# Patient Record
Sex: Male | Born: 2000 | Hispanic: Yes | Marital: Single | Smoking: Never smoker
Health system: Southern US, Community
[De-identification: ages and names within clinical notes are randomized; demographics above are authoritative.]

## PROBLEM LIST (undated history)

## (undated) DIAGNOSIS — M2141 Flat foot [pes planus] (acquired), right foot: Secondary | ICD-10-CM

## (undated) HISTORY — DX: Flat foot (pes planus) (acquired), right foot: M21.41

---
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
## 2019-03-06 ENCOUNTER — Emergency Department
Admission: EM | Admit: 2019-03-06 | Discharge: 2019-03-06 | Disposition: A | Discharge status: Home / Self Care | Payer: Medicaid Other | Source: Home / Self Care | Attending: Family Medicine | Admitting: Family Medicine

## 2019-03-06 ENCOUNTER — Other Ambulatory Visit: Payer: Self-pay

## 2019-03-06 ENCOUNTER — Emergency Department (INDEPENDENT_AMBULATORY_CARE_PROVIDER_SITE_OTHER): Payer: Medicaid Other

## 2019-03-06 DIAGNOSIS — R1013 Epigastric pain: Secondary | ICD-10-CM

## 2019-03-06 DIAGNOSIS — R140 Abdominal distension (gaseous): Secondary | ICD-10-CM

## 2019-03-06 LAB — POCT URINALYSIS DIP (MANUAL ENTRY)
Bilirubin, UA: NEGATIVE
Blood, UA: NEGATIVE
Glucose, UA: NEGATIVE mg/dL
Ketones, POC UA: NEGATIVE mg/dL
Leukocytes, UA: NEGATIVE
Nitrite, UA: NEGATIVE
Protein Ur, POC: NEGATIVE mg/dL
Spec Grav, UA: 1.02 (ref 1.010–1.025)
Urobilinogen, UA: 0.2 E.U./dL
pH, UA: 8.5 — AB (ref 5.0–8.0)

## 2019-03-06 LAB — POCT CBC W AUTO DIFF (K'VILLE URGENT CARE)

## 2019-03-06 MED ORDER — OMEPRAZOLE 40 MG PO CPDR: DELAYED_RELEASE_CAPSULE | ORAL | 0 refills | Status: DC

## 2019-03-06 NOTE — ED Triage Notes (Signed)
Pt c/o stomach pain x 1 week. Sometimes bloated feeling. Denies N/V/D. Worse when laying down. Has tried drinking tea and Tums with no relief.

## 2019-03-06 NOTE — ED Provider Notes (Signed)
[facility]    CSN: [id number] Arrival date & time: 03/06/19  1323     History   Chief Complaint Chief Complaint  Patient presents with  . Abdominal Pain    Stomach pain    HPI Jamie Ochoa is a 18 y.o. male.   Patient complains of onset of epigastric pain and abdominal bloating about one week ago.  The pain is immediately worse after eating, and during the past 3 days he has not been able to sleep because of the pain.  He denies nausea/vomiting and changes in bowel movements.  He reports that baking soda and Tums helped somewhat initially.  She states that he has had occasional heartburn in the past. He admits that he often eats at bedtime.  The history is provided by the patient.  Abdominal Pain  Pain location:  Epigastric Pain quality: aching and bloating   Pain radiates to:  Does not radiate Pain severity:  Moderate Onset quality:  Sudden Duration:  1 week Timing:  Intermittent Progression:  Worsening Chronicity:  New Context: awakening from sleep and eating   Context: not alcohol use, not diet changes, not previous surgeries, not recent illness and not suspicious food intake   Relieved by:  Nothing Worsened by:  Eating Ineffective treatments: Tums and baking soda. Associated symptoms: no anorexia, no belching, no chest pain, no chills, no constipation, no cough, no diarrhea, no dysuria, no fatigue, no fever, no flatus, no hematemesis, no hematochezia, no hematuria, no melena, no nausea, no shortness of breath, no sore throat and no vomiting     Past medical history negative  Patient Active Problem List   Diagnosis Date Noted  . BMI (body mass index), pediatric, 85% to less than 95% for age 60/13/2016     Past surgical history negative     Home Medications    Prior to Admission medications   Medication Sig Start Date End Date Taking? Authorizing Provider  meloxicam (MOBIC) 7.5 MG tablet Take 1 tablet (7.5 mg total) by mouth daily.  04/15/18   Belinda Fisher, PA-C  omeprazole (PRILOSEC) 40 MG capsule Take one cap PO daily, about 20 minutes before evening meal 03/06/19   Lattie Haw, MD    Family History Negative  Social History Social History   Tobacco Use  . Smoking status: Never Smoker  . Smokeless tobacco: Never Used  Substance Use Topics  . Alcohol use: No  . Drug use: Not on file     Allergies   Patient has no known allergies.   Review of Systems Review of Systems  Constitutional: Negative for chills, fatigue and fever.  HENT: Negative for sore throat.   Respiratory: Negative for cough and shortness of breath.   Cardiovascular: Negative for chest pain.  Gastrointestinal: Positive for abdominal pain. Negative for anorexia, constipation, diarrhea, flatus, hematemesis, hematochezia, melena, nausea and vomiting.  Genitourinary: Negative for dysuria and hematuria.  All other systems reviewed and are negative.    Physical Exam Triage Vital Signs ED Triage Vitals  Enc Vitals Group     BP 03/06/19 1348 123/83     Pulse Rate 03/06/19 1348 96     Resp --      Temp 03/06/19 1348 97.6 F (36.4 C)     Temp Source 03/06/19 1348 Oral     SpO2 03/06/19 1348 99 %     Weight 03/06/19 1349 211 lb (95.7 kg)     Height --      Head  Circumference --      Peak Flow --      Pain Score 03/06/19 1348 10     Pain Loc --      Pain Edu? --      Excl. in GC? --    No data found.  Updated Vital Signs BP 123/83 (BP Location: Right Arm)   Pulse 96   Temp 97.6 F (36.4 C) (Oral)   Wt 95.7 kg   SpO2 99%   Visual Acuity Right Eye Distance:   Left Eye Distance:   Bilateral Distance:    Right Eye Near:   Left Eye Near:    Bilateral Near:     Physical Exam Vitals signs and nursing note reviewed.  Constitutional:      General: He is not in acute distress. HENT:     Head: Normocephalic.     Right Ear: External ear normal.     Left Ear: External ear normal.     Nose: Nose normal.     Mouth/Throat:      Pharynx: Oropharynx is clear.  Eyes:     Pupils: Pupils are equal, round, and reactive to light.  Cardiovascular:     Heart sounds: Normal heart sounds.  Pulmonary:     Breath sounds: Normal breath sounds.  Abdominal:     General: Abdomen is flat. Bowel sounds are normal. There is no distension.     Palpations: Abdomen is soft. There is no hepatomegaly, splenomegaly or mass.     Tenderness: There is abdominal tenderness in the epigastric area. There is no right CVA tenderness or left CVA tenderness. Negative signs include Murphy's sign and McBurney's sign.    Musculoskeletal:     Right lower leg: No edema.     Left lower leg: No edema.  Lymphadenopathy:     Cervical: No cervical adenopathy.  Skin:    General: Skin is warm and dry.     Findings: No rash.  Neurological:     Mental Status: He is alert and oriented to person, place, and time.      UC Treatments / Results  Labs (all labs ordered are listed, but only abnormal results are displayed) Labs Reviewed  POCT URINALYSIS DIP (MANUAL ENTRY) - Abnormal; Notable for the following components:      Result Value   pH, UA 8.5 (*)    All other components within normal limits  POCT CBC W AUTO DIFF ([facility]):  WBC 6.6; LY 46.5; MO 3.6; GR 49.9; Hgb 15.0; Platelets 226     EKG None  Radiology Dg Abdomen 1 View  Result Date: 03/06/2019 CLINICAL DATA:  18 year old male with history of epigastric pain and bloating for the past 3 days. EXAM: ABDOMEN - 1 VIEW COMPARISON:  No priors. FINDINGS: The bowel gas pattern is normal. No radio-opaque calculi or other significant radiographic abnormality are seen. IMPRESSION: Negative. Electronically Signed   By: Trudie Reedaniel  Entrikin M.D.   On: 03/06/2019 14:49    Procedures Procedures (including critical care time)  Medications Ordered in UC Medications - No data to display  Initial Impression / Assessment and Plan / UC Course  I have reviewed the triage vital signs and the  nursing notes.  Pertinent labs & imaging results that were available during my care of the patient were reviewed by me and considered in my medical decision making (see chart for details).    Negative urinalysis, CBC, and KUB reassuring. ?GERD, Gastric ulcer Begin Omeprazole. Followup with Family  Doctor in about 2 weeks.   Final Clinical Impressions(s) / UC Diagnoses   Final diagnoses:  Abdominal pain, epigastric     Discharge Instructions     Avoid bedtime snacks or meals    ED Prescriptions    Medication Sig Dispense Auth. Provider   omeprazole (PRILOSEC) 40 MG capsule Take one cap PO daily, about 20 minutes before evening meal 30 capsule Lattie Haw, MD        Lattie Haw, MD 03/06/19 2150

## 2019-03-06 NOTE — Discharge Instructions (Addendum)
Avoid bedtime snacks or meals

## 2020-09-04 IMAGING — DX ABDOMEN - 1 VIEW
2 series · 2 of 2 positions shown · non-contrast
Comparison: No priors.

CLINICAL DATA: 17-year-old male with history of epigastric pain and
bloating for the past 3 days.

EXAM:
ABDOMEN - 1 VIEW

[abdomen kub (1 of 2)]
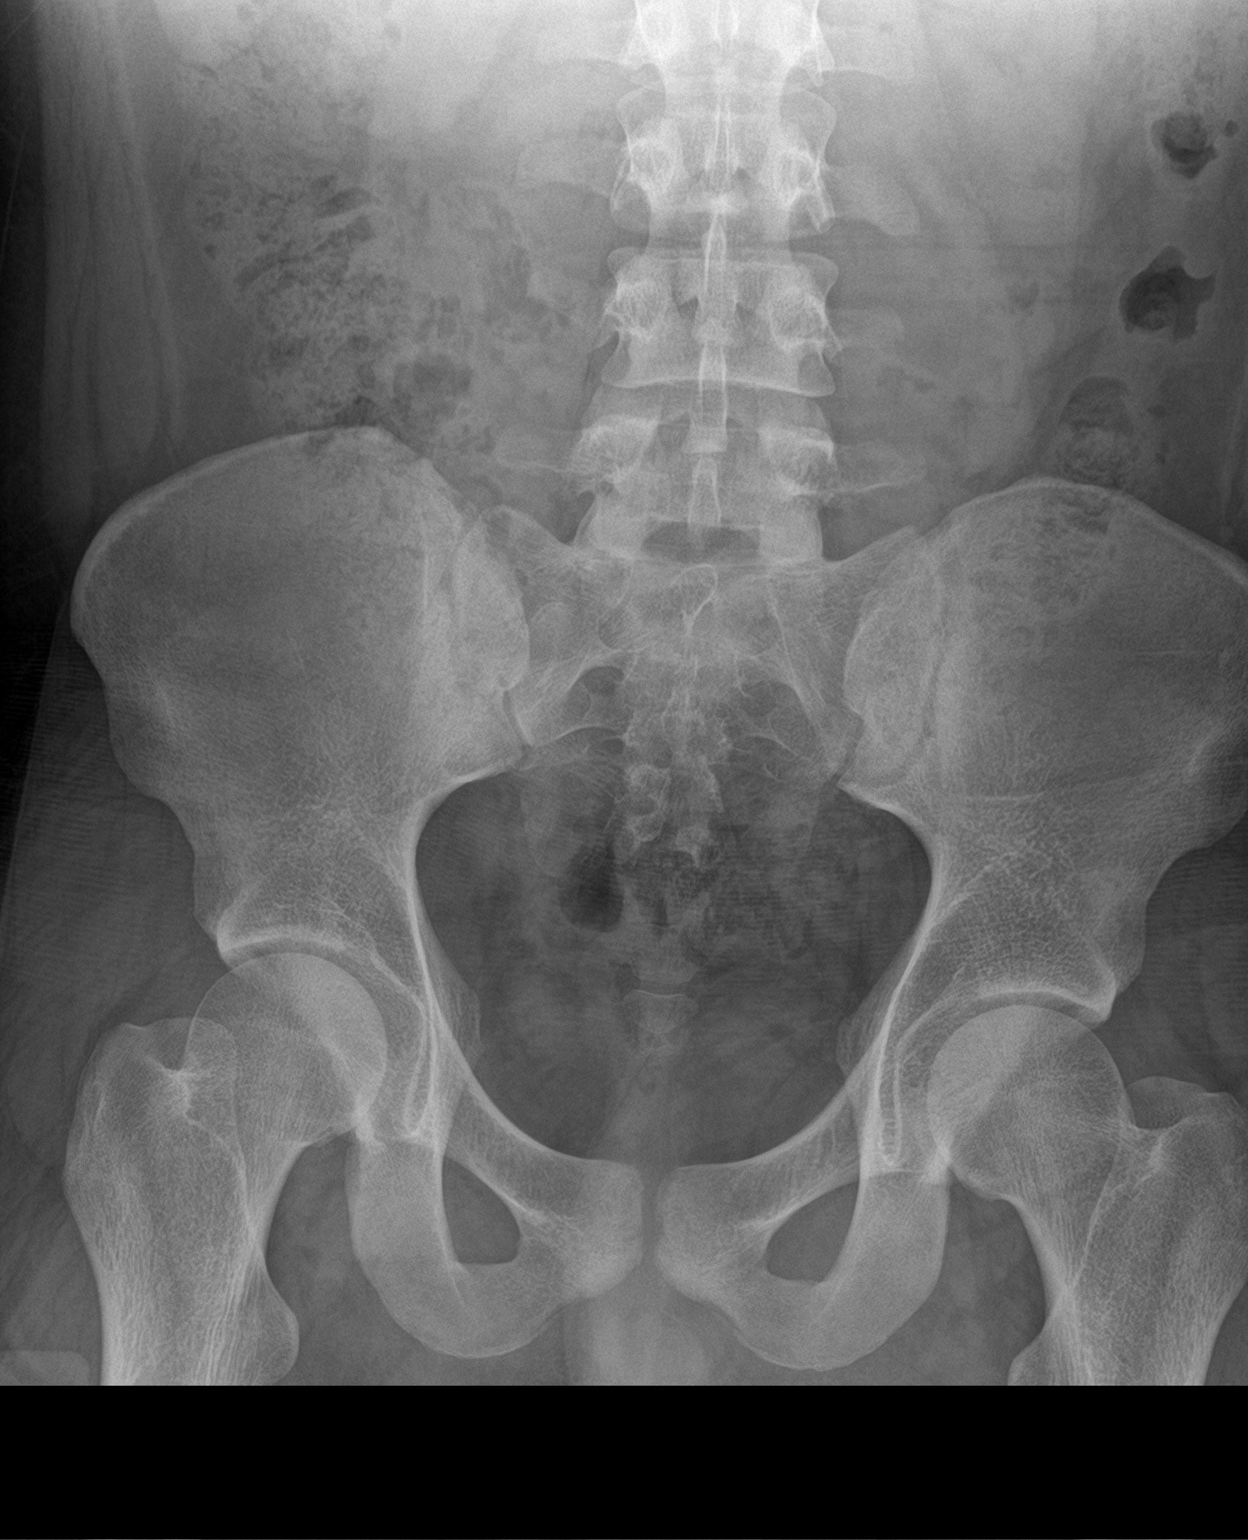

[abdomen kub (2 of 2)]
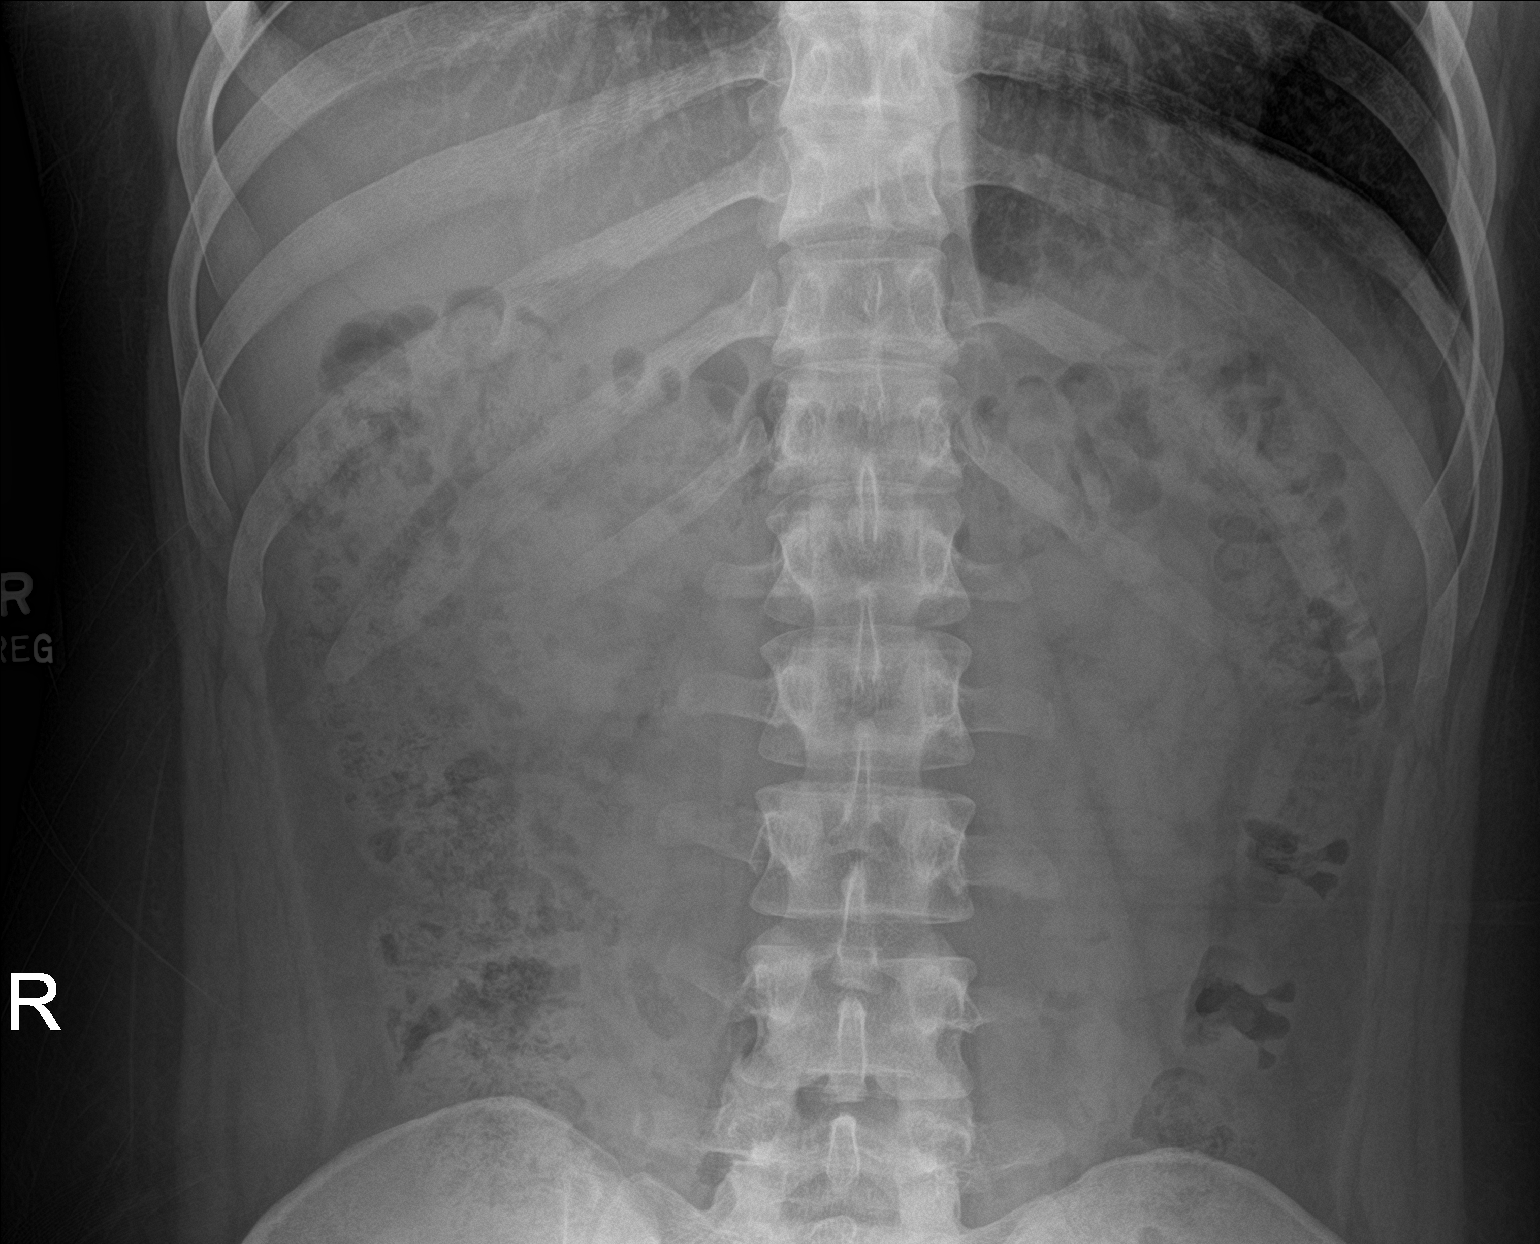

[2 of 2 positions shown; findings below may reference images not displayed]

FINDINGS: The bowel gas pattern is normal. No radio-opaque calculi or other
significant radiographic abnormality are seen.
IMPRESSION: Negative.

## 2022-06-30 ENCOUNTER — Ambulatory Visit (HOSPITAL_COMMUNITY)
Admission: EM | Admit: 2022-06-30 | Discharge: 2022-06-30 | Disposition: A | Discharge status: Home / Self Care | Payer: Medicaid Other | Attending: Emergency Medicine | Admitting: Emergency Medicine

## 2022-06-30 ENCOUNTER — Encounter (HOSPITAL_COMMUNITY): Payer: Self-pay

## 2022-06-30 DIAGNOSIS — L600 Ingrowing nail: Secondary | ICD-10-CM

## 2022-06-30 DIAGNOSIS — L089 Local infection of the skin and subcutaneous tissue, unspecified: Secondary | ICD-10-CM

## 2022-06-30 MED ORDER — CEPHALEXIN 500 MG PO CAPS: 500.0000 mg | ORAL_CAPSULE | Freq: Three times a day (TID) | ORAL | 0 refills | Status: AC

## 2022-06-30 NOTE — Discharge Instructions (Addendum)
Please follow up with the foot specialists regarding your toenail.  Take the antibiotic 3 times daily for 5 days. Take with food to avoid upset stomach.

## 2022-06-30 NOTE — ED Provider Notes (Signed)
[facility]    CSN: [id number] Arrival date & time: 06/30/22  1305     History   Chief Complaint Chief Complaint  Patient presents with   ingrown toe nail    HPI Jamie Ochoa is a 21 y.o. male.  Presents with toe infection Reports had an ingrown toenail on the right great toe, partially removed at an urgent care 2 months ago.  He did take antibiotics at that time but reports area has appeared infected and bleeding since removal.  Reports pain and pustular drainage.  Denies any injury to the foot.  No fevers.  History reviewed. No pertinent past medical history.  Patient Active Problem List   Diagnosis Date Noted   BMI (body mass index), pediatric, 85% to less than 95% for age 62/13/2016    History reviewed. No pertinent surgical history.     Home Medications    Prior to Admission medications   Medication Sig Start Date End Date Taking? Authorizing Provider  cephALEXin (KEFLEX) 500 MG capsule Take 1 capsule (500 mg total) by mouth 3 (three) times daily for 5 days. 06/30/22 07/05/22 Yes Dalvin Clipper, Lurena Joiner, PA-C  meloxicam (MOBIC) 7.5 MG tablet Take 1 tablet (7.5 mg total) by mouth daily. 04/15/18   Belinda Fisher, PA-C  omeprazole (PRILOSEC) 40 MG capsule Take one cap PO daily, about 20 minutes before evening meal 03/06/19   Lattie Haw, MD    Family History History reviewed. No pertinent family history.  Social History Social History   Tobacco Use   Smoking status: Never   Smokeless tobacco: Never  Substance Use Topics   Alcohol use: No     Allergies   Patient has no known allergies.   Review of Systems Review of Systems As per HPI  Physical Exam Triage Vital Signs ED Triage Vitals [06/30/22 1329]  Enc Vitals Group     BP 139/89     Pulse Rate 89     Resp 18     Temp 97.9 F (36.6 C)     Temp Source Oral     SpO2 97 %     Weight      Height      Head Circumference      Peak Flow      Pain Score 0     Pain Loc      Pain  Edu?      Excl. in GC?    No data found.  Updated Vital Signs BP 139/89 (BP Location: Left Arm)   Pulse 89   Temp 97.9 F (36.6 C) (Oral)   Resp 18   SpO2 97%      Physical Exam Vitals and nursing note reviewed.  Constitutional:      General: He is not in acute distress.    Appearance: Normal appearance.  Cardiovascular:     Rate and Rhythm: Normal rate and regular rhythm.     Pulses: Normal pulses.     Heart sounds: Normal heart sounds.  Pulmonary:     Effort: Pulmonary effort is normal.     Breath sounds: Normal breath sounds.  Musculoskeletal:        General: Normal range of motion.       Feet:  Feet:     Comments: Right great toe, medial part of the toenail is removed.  There is an area of old blood collection, some yellow drainage from the area.  Area is tender to touch.  The rest of the  toe is nontender.  No warmth or erythema noted. Neurological:     Mental Status: He is alert and oriented to person, place, and time.      UC Treatments / Results  Labs (all labs ordered are listed, but only abnormal results are displayed) Labs Reviewed - No data to display  EKG   Radiology No results found.  Procedures Procedures   Medications Ordered in UC Medications - No data to display  Initial Impression / Assessment and Plan / UC Course  I have reviewed the triage vital signs and the nursing notes.  Pertinent labs & imaging results that were available during my care of the patient were reviewed by me and considered in my medical decision making (see chart for details).  We will treat for toe infection with Keflex 3 times daily for 5 days.  Provided information for [facility].  Recommend calling to set up an appoint with them for evaluation.  In the meantime can try Tylenol/ibuprofen for pain.  Keep area clean and dry.  Return precautions discussed.  Patient agrees with plan  Final Clinical Impressions(s) / UC Diagnoses   Final diagnoses:  Toe  infection  Ingrown nail of great toe of right foot     Discharge Instructions      Please follow up with the foot specialists regarding your toenail.  Take the antibiotic 3 times daily for 5 days. Take with food to avoid upset stomach.     ED Prescriptions     Medication Sig Dispense Auth. Provider   cephALEXin (KEFLEX) 500 MG capsule Take 1 capsule (500 mg total) by mouth 3 (three) times daily for 5 days. 15 capsule Sofia Jaquith, Lurena Joiner, PA-C      PDMP not reviewed this encounter.   Marlow Baars, [place] 06/30/22 1413

## 2022-06-30 NOTE — ED Triage Notes (Signed)
Pt c/o ingrown toe nail to rt great toe for a month. States had it removed 2 months ago and never got better. C/o pain and drainage.

## 2022-08-08 ENCOUNTER — Ambulatory Visit (INDEPENDENT_AMBULATORY_CARE_PROVIDER_SITE_OTHER): Payer: Medicaid Other | Admitting: Podiatry

## 2022-08-08 DIAGNOSIS — L600 Ingrowing nail: Secondary | ICD-10-CM | POA: Diagnosis not present

## 2022-08-08 NOTE — Progress Notes (Unsigned)
  Subjective:  Patient ID: Jamie Ochoa, male    DOB: March 06, 2001,  MRN: [id number]  Chief Complaint  Patient presents with   Ingrown Toenail    21 y.o. male presents with the above complaint.  Patient presents with right hallux medial border ingrown pain for touch is progressive gotten worse worse with ambulation worse with shoe.  He would like to have it removed.  He has not seen anyone else prior to seeing me he denies any other acute complaints.  Pain scale 7 out of 10.  He is tried removing himself which has not helped.   Review of Systems: Negative except as noted in the HPI. Denies N/V/F/Ch.  No past medical history on file.  Current Outpatient Medications:    meloxicam (MOBIC) 7.5 MG tablet, Take 1 tablet (7.5 mg total) by mouth daily., Disp: 15 tablet, Rfl: 0   omeprazole (PRILOSEC) 40 MG capsule, Take one cap PO daily, about 20 minutes before evening meal, Disp: 30 capsule, Rfl: 0  Social History   Tobacco Use  Smoking Status Never  Smokeless Tobacco Never    No Known Allergies Objective:  There were no vitals filed for this visit. There is no height or weight on file to calculate BMI. Constitutional Well developed. Well nourished.  Vascular Dorsalis pedis pulses palpable bilaterally. Posterior tibial pulses palpable bilaterally. Capillary refill normal to all digits.  No cyanosis or clubbing noted. Pedal hair growth normal.  Neurologic Normal speech. Oriented to person, place, and time. Epicritic sensation to light touch grossly present bilaterally.  Dermatologic Painful ingrowing nail at medial nail borders of the hallux nail right. No other open wounds. No skin lesions.  Orthopedic: Normal joint ROM without pain or crepitus bilaterally. No visible deformities. No bony tenderness.   Radiographs: None Assessment:   1. Ingrown toenail of right foot    Plan:  Patient was evaluated and treated and all questions answered.  Ingrown Nail,  right -Patient elects to proceed with minor surgery to remove ingrown toenail removal today. Consent reviewed and signed by patient. -Ingrown nail excised. See procedure note. -Educated on post-procedure care including soaking. Written instructions provided and reviewed. -Patient to follow up in 2 weeks for nail check.  Procedure: Excision of Ingrown Toenail Location: Right 1st toe medial nail borders. Anesthesia: Lidocaine 1% plain; 1.5 mL and Marcaine 0.5% plain; 1.5 mL, digital block. Skin Prep: Betadine. Dressing: Silvadene; telfa; dry, sterile, compression dressing. Technique: Following skin prep, the toe was exsanguinated and a tourniquet was secured at the base of the toe. The affected nail border was freed, split with a nail splitter, and excised. Chemical matrixectomy was then performed with phenol and irrigated out with alcohol. The tourniquet was then removed and sterile dressing applied. Disposition: Patient tolerated procedure well. Patient to return in 2 weeks for follow-up.   No follow-ups on file.

## 2023-03-06 ENCOUNTER — Ambulatory Visit: Payer: BC Managed Care – PPO | Admitting: Family Medicine

## 2023-03-13 ENCOUNTER — Ambulatory Visit: Payer: BC Managed Care – PPO | Admitting: Nurse Practitioner

## 2023-03-13 ENCOUNTER — Encounter: Payer: Self-pay | Admitting: Nurse Practitioner

## 2023-03-13 ENCOUNTER — Other Ambulatory Visit (HOSPITAL_COMMUNITY)
Admission: RE | Admit: 2023-03-13 | Discharge: 2023-03-13 | Disposition: A | Discharge status: Home / Self Care | Payer: BC Managed Care – PPO | Source: Ambulatory Visit | Attending: Family Medicine | Admitting: Family Medicine

## 2023-03-13 VITALS — BP 124/88 | HR 70 | Temp 97.0°F | Ht 69.0 in | Wt 228.0 lb

## 2023-03-13 DIAGNOSIS — Z113 Encounter for screening for infections with a predominantly sexual mode of transmission: Secondary | ICD-10-CM | POA: Insufficient documentation

## 2023-03-13 DIAGNOSIS — Z23 Encounter for immunization: Secondary | ICD-10-CM

## 2023-03-13 DIAGNOSIS — Z0000 Encounter for general adult medical examination without abnormal findings: Secondary | ICD-10-CM

## 2023-03-13 NOTE — Patient Instructions (Signed)
It was great to see you!  We are checking your labs today and will let you know the results via mychart/phone.   Let's follow-up in 1 year, sooner if you have concerns.  If a referral was placed today, you will be contacted for an appointment. Please note that routine referrals can sometimes take up to 3-4 weeks to process. Please call our office if you haven't heard anything after this time frame.  Take care,  Alesha Jaffee, NP  

## 2023-03-13 NOTE — Assessment & Plan Note (Signed)
Health maintenance reviewed and updated. Discussed nutrition, exercise. Check CMP, CBC, TSH today. Follow-up 1 year.   

## 2023-03-13 NOTE — Progress Notes (Signed)
New Patient Visit  BP 124/88 (BP Location: Left Arm)   Pulse 70   Temp (!) 97 F (36.1 C)   Ht 5\' 9"  (1.753 m)   Wt 228 lb (103.4 kg)   SpO2 96%   BMI 33.67 kg/m    Subjective:    Patient ID: Jamie Ochoa, male    DOB: December 17, 2000, 22 y.o.   MRN: [id number]  CC: Chief Complaint  Patient presents with   Establish Care    NP. Est. Care, STD testing and lab work    HPI: Jamie Ochoa is a 22 y.o. male presents for new patient visit to establish care.  Introduced to Publishing rights manager role and practice setting.  All questions answered.  Discussed provider/patient relationship and expectations.  He is looking a have an overall physical exam today. He has no concerns. He would like STD testing done.   Depression and Anxiety Screen done:     03/13/2023    3:34 PM  Depression screen PHQ 2/9  Decreased Interest 0  Down, Depressed, Hopeless 0  PHQ - 2 Score 0  Altered sleeping 0  Tired, decreased energy 0  Change in appetite 1  Feeling bad or failure about yourself  0  Trouble concentrating 0  Moving slowly or fidgety/restless 0  Suicidal thoughts 0  PHQ-9 Score 1      03/13/2023    3:34 PM  GAD 7 : Generalized Anxiety Score  Nervous, Anxious, on Edge 0  Control/stop worrying 0  Worry too much - different things 0  Trouble relaxing 0  Restless 0  Easily annoyed or irritable 0  Afraid - awful might happen 0  Total GAD 7 Score 0    Past Medical History:  Diagnosis Date   Flat feet     History reviewed. No pertinent surgical history.  Family History  Problem Relation Age of Onset   Healthy Mother    Hypertension Father      Social History   Tobacco Use   Smoking status: Never   Smokeless tobacco: Never  Vaping Use   Vaping Use: Never used  Substance Use Topics   Alcohol use: No   Drug use: Not Currently    Types: Marijuana    No current outpatient medications on file prior to visit.   No current facility-administered  medications on file prior to visit.     Review of Systems  Constitutional: Negative.   HENT: Negative.    Respiratory: Negative.    Cardiovascular: Negative.   Gastrointestinal: Negative.   Genitourinary: Negative.   Musculoskeletal: Negative.   Skin: Negative.   Neurological: Negative.   Psychiatric/Behavioral: Negative.        Objective:    BP 124/88 (BP Location: Left Arm)   Pulse 70   Temp (!) 97 F (36.1 C)   Ht 5\' 9"  (1.753 m)   Wt 228 lb (103.4 kg)   SpO2 96%   BMI 33.67 kg/m   Wt Readings from Last 3 Encounters:  03/13/23 228 lb (103.4 kg)  03/06/19 211 lb (95.7 kg) (96 %, Z= 1.73)*  02/21/15 162 lb 9.6 oz (73.8 kg) (92 %, Z= 1.38)*   * Growth percentiles are based on CDC (Boys, 2-20 Years) data.    BP Readings from Last 3 Encounters:  03/13/23 124/88  06/30/22 139/89  03/06/19 123/83    Physical Exam Vitals and nursing note reviewed.  Constitutional:      General: He is not in acute distress.  Appearance: Normal appearance.  HENT:     Head: Normocephalic and atraumatic.     Right Ear: Tympanic membrane, ear canal and external ear normal.     Left Ear: Tympanic membrane, ear canal and external ear normal.  Eyes:     Conjunctiva/sclera: Conjunctivae normal.  Cardiovascular:     Rate and Rhythm: Normal rate and regular rhythm.     Pulses: Normal pulses.     Heart sounds: Normal heart sounds.  Pulmonary:     Effort: Pulmonary effort is normal.     Breath sounds: Normal breath sounds.  Abdominal:     Palpations: Abdomen is soft.     Tenderness: There is no abdominal tenderness.  Musculoskeletal:        General: Normal range of motion.     Cervical back: Normal range of motion and neck supple. No tenderness.     Right lower leg: No edema.     Left lower leg: No edema.  Lymphadenopathy:     Cervical: No cervical adenopathy.  Skin:    General: Skin is warm and dry.  Neurological:     General: No focal deficit present.     Mental Status: He is  alert and oriented to person, place, and time.     Cranial Nerves: No cranial nerve deficit.     Coordination: Coordination normal.     Gait: Gait normal.  Psychiatric:        Mood and Affect: Mood normal.        Behavior: Behavior normal.        Thought Content: Thought content normal.        Judgment: Judgment normal.        Assessment & Plan:   Problem List Items Addressed This Visit       Other   Routine general medical examination at a health care facility - Primary    Health maintenance reviewed and updated. Discussed nutrition, exercise. Check CMP, CBC, TSH today. Follow-up 1 year.        Relevant Orders   CBC   Comprehensive metabolic panel   TSH   Other Visit Diagnoses     Immunization due       Tdap given today   Relevant Orders   Tdap vaccine greater than or equal to 7yo IM (Completed)   Screen for STD (sexually transmitted disease)       Screen STDs today per patient request.   Relevant Orders   Urine cytology ancillary only   Hepatitis C antibody   HIV Antibody (routine testing w rflx)   RPR       IMMUNIZATIONS:   - Tdap: Tetanus vaccination status reviewed: Td vaccination indicated and given today. - Influenza: Postponed to flu season - Pneumovax: Not applicable - Prevnar: Not applicable - HPV: Up to date - Zostavax vaccine: Not applicable  SCREENING: - Colonoscopy: Not applicable  Discussed with patient purpose of the colonoscopy is to detect colon cancer at curable precancerous or early stages   - AAA Screening: Not applicable  -Hearing Test: Not applicable  -Spirometry: Not applicable   PATIENT COUNSELING:    Sexuality: Discussed sexually transmitted diseases, partner selection, use of condoms, avoidance of unintended pregnancy  and contraceptive alternatives.   Advised to avoid cigarette smoking.  I discussed with the patient that most people either abstain from alcohol or drink within safe limits (<=14/week and <=4 drinks/occasion  for males, <=7/weeks and <= 3 drinks/occasion for females) and that the risk for  alcohol disorders and other health effects rises proportionally with the number of drinks per week and how often a drinker exceeds daily limits.  Discussed cessation/primary prevention of drug use and availability of treatment for abuse.   Diet: Encouraged to adjust caloric intake to maintain  or achieve ideal body weight, to reduce intake of dietary saturated fat and total fat, to limit sodium intake by avoiding high sodium foods and not adding table salt, and to maintain adequate dietary potassium and calcium preferably from fresh fruits, vegetables, and low-fat dairy products.    stressed the importance of regular exercise  Injury prevention: Discussed safety belts, safety helmets, smoke detector, smoking near bedding or upholstery.   Dental health: Discussed importance of regular tooth brushing, flossing, and dental visits.   Follow up plan: NEXT PREVENTATIVE PHYSICAL DUE IN 1 YEAR.   Follow up plan: Return in about 1 year (around 03/12/2024) for CPE.

## 2023-03-14 LAB — COMPREHENSIVE METABOLIC PANEL
AG Ratio: 1.5 (calc) (ref 1.0–2.5)
ALT: 16 U/L (ref 9–46)
AST: 19 U/L (ref 10–40)
Albumin: 4.3 g/dL (ref 3.6–5.1)
Alkaline phosphatase (APISO): 69 U/L (ref 36–130)
BUN: 14 mg/dL (ref 7–25)
CO2: 26 mmol/L (ref 20–32)
Calcium: 8.9 mg/dL (ref 8.6–10.3)
Chloride: 106 mmol/L (ref 98–110)
Creat: 0.82 mg/dL (ref 0.60–1.24)
Globulin: 2.9 g/dL (calc) (ref 1.9–3.7)
Glucose, Bld: 106 mg/dL — ABNORMAL HIGH (ref 65–99)
Potassium: 4.2 mmol/L (ref 3.5–5.3)
Sodium: 139 mmol/L (ref 135–146)
Total Bilirubin: 0.4 mg/dL (ref 0.2–1.2)
Total Protein: 7.2 g/dL (ref 6.1–8.1)

## 2023-03-14 LAB — HEPATITIS C ANTIBODY: Hepatitis C Ab: NONREACTIVE

## 2023-03-14 LAB — CBC
HCT: 42.5 % (ref 38.5–50.0)
Hemoglobin: 14.5 g/dL (ref 13.2–17.1)
MCH: 29.9 pg (ref 27.0–33.0)
MCHC: 34.1 g/dL (ref 32.0–36.0)
MCV: 87.6 fL (ref 80.0–100.0)
MPV: 10.4 fL (ref 7.5–12.5)
Platelets: 242 10*3/uL (ref 140–400)
RBC: 4.85 10*6/uL (ref 4.20–5.80)
RDW: 13.2 % (ref 11.0–15.0)
WBC: 7.5 10*3/uL (ref 3.8–10.8)

## 2023-03-14 LAB — HIV ANTIBODY (ROUTINE TESTING W REFLEX): HIV 1&2 Ab, 4th Generation: NONREACTIVE

## 2023-03-14 LAB — RPR: RPR Ser Ql: NONREACTIVE

## 2023-03-14 LAB — TIQ-NTM

## 2023-03-14 LAB — TSH: TSH: 0.8 mIU/L (ref 0.40–4.50)

## 2023-03-17 LAB — URINE CYTOLOGY ANCILLARY ONLY
Chlamydia: POSITIVE — AB
Comment: NEGATIVE
Comment: NORMAL
Neisseria Gonorrhea: NEGATIVE

## 2023-03-17 MED ORDER — DOXYCYCLINE HYCLATE 100 MG PO TABS: 100.0000 mg | ORAL_TABLET | Freq: Two times a day (BID) | ORAL | 0 refills | Status: DC

## 2023-03-17 NOTE — Addendum Note (Signed)
Addended by: Rodman Pickle A on: 03/17/2023 04:34 PM   Modules accepted: Orders

## 2023-03-18 ENCOUNTER — Telehealth: Payer: Self-pay | Admitting: Nurse Practitioner

## 2023-03-18 MED ORDER — DOXYCYCLINE HYCLATE 100 MG PO TABS: 100.0000 mg | ORAL_TABLET | Freq: Two times a day (BID) | ORAL | 0 refills | Status: DC

## 2023-03-18 NOTE — Telephone Encounter (Signed)
I called patient and let him know that antibiotic sent to [facility] in [place] as requested.

## 2023-03-18 NOTE — Telephone Encounter (Signed)
Pt has called back and requested that his med be sent to [facility] [address] [place] [address] [phone]  doxycycline (VIBRA-TABS) 100 MG tablet [[id number]]

## 2023-03-18 NOTE — Telephone Encounter (Signed)
doxycycline (VIBRA-TABS) 100 MG tablet [[id number]]   [place] [phone]  Pt needs a refill he has 0 left. [facility] [place]

## 2023-06-19 ENCOUNTER — Encounter: Payer: Self-pay | Admitting: Nurse Practitioner

## 2023-06-19 ENCOUNTER — Ambulatory Visit: Payer: BC Managed Care – PPO | Admitting: Nurse Practitioner

## 2023-06-19 ENCOUNTER — Other Ambulatory Visit (HOSPITAL_COMMUNITY)
Admission: RE | Admit: 2023-06-19 | Discharge: 2023-06-19 | Disposition: A | Discharge status: Home / Self Care | Payer: BC Managed Care – PPO | Source: Ambulatory Visit | Attending: Nurse Practitioner | Admitting: Nurse Practitioner

## 2023-06-19 VITALS — BP 120/76 | HR 78 | Temp 98.6°F | Ht 69.0 in | Wt 237.6 lb

## 2023-06-19 DIAGNOSIS — Z113 Encounter for screening for infections with a predominantly sexual mode of transmission: Secondary | ICD-10-CM

## 2023-06-19 DIAGNOSIS — Z8619 Personal history of other infectious and parasitic diseases: Secondary | ICD-10-CM

## 2023-06-19 NOTE — Patient Instructions (Signed)
It was great to see you!  We are checking your labs today and will let you know the results via mychart/phone.   Let's follow-up in 9 months, sooner if you have concerns.  If a referral was placed today, you will be contacted for an appointment. Please note that routine referrals can sometimes take up to 3-4 weeks to process. Please call our office if you haven't heard anything after this time frame.  Take care,  Rodman Pickle, NP

## 2023-06-19 NOTE — Progress Notes (Signed)
   Established Patient Office Visit  Subjective   Patient ID: Jamie Ochoa, male    DOB: 2001-07-31  Age: 22 y.o. MRN: [id number]  Chief Complaint  Patient presents with   Follow-up    F/u to repeat test.   No concerns.      HPI  Jamie Ochoa is here for repeat STD testing.   He states that he completed the doxycycline as prescribed for chlamydia. He has been sexually active since then and has been using condoms. He denies dysuria and penile discharge however would like complete repeat STD testing.     ROS See pertinent positives and negatives per HPI.    Objective:     BP 120/76   Pulse 78   Temp 98.6 F (37 C) (Temporal)   Ht 5\' 9"  (1.753 m)   Wt 237 lb 9.6 oz (107.8 kg)   SpO2 99%   BMI 35.09 kg/m    Physical Exam Vitals and nursing note reviewed.  Constitutional:      Appearance: Normal appearance.  HENT:     Head: Normocephalic.  Eyes:     Conjunctiva/sclera: Conjunctivae normal.  Cardiovascular:     Rate and Rhythm: Normal rate and regular rhythm.     Pulses: Normal pulses.     Heart sounds: Normal heart sounds.  Pulmonary:     Effort: Pulmonary effort is normal.     Breath sounds: Normal breath sounds.  Musculoskeletal:     Cervical back: Normal range of motion.  Skin:    General: Skin is warm.  Neurological:     General: No focal deficit present.     Mental Status: He is alert and oriented to person, place, and time.  Psychiatric:        Mood and Affect: Mood normal.        Behavior: Behavior normal.        Thought Content: Thought content normal.        Judgment: Judgment normal.     Assessment & Plan:   Problem List Items Addressed This Visit   None Visit Diagnoses     Screen for STD (sexually transmitted disease)    -  Primary   Screen chlamydia, gonorrhea, and HIV per patient request   Relevant Orders   HIV Antibody (routine testing w rflx)   History of chlamydia       3 month since treatment. Will check  chlamydia test today   Relevant Orders   Urine cytology ancillary only       Return in about 9 months (around 03/18/2024) for CPE.    Gerre Scull, NP

## 2023-10-06 ENCOUNTER — Encounter: Payer: Self-pay | Admitting: Nurse Practitioner

## 2023-10-06 ENCOUNTER — Ambulatory Visit: Payer: BC Managed Care – PPO | Admitting: Nurse Practitioner

## 2023-10-06 ENCOUNTER — Other Ambulatory Visit (HOSPITAL_COMMUNITY)
Admission: RE | Admit: 2023-10-06 | Discharge: 2023-10-06 | Disposition: A | Discharge status: Home / Self Care | Payer: BC Managed Care – PPO | Source: Ambulatory Visit | Attending: Nurse Practitioner | Admitting: Nurse Practitioner

## 2023-10-06 VITALS — BP 128/88 | HR 102 | Temp 97.1°F | Ht 69.0 in | Wt 253.2 lb

## 2023-10-06 DIAGNOSIS — L731 Pseudofolliculitis barbae: Secondary | ICD-10-CM

## 2023-10-06 DIAGNOSIS — R7301 Impaired fasting glucose: Secondary | ICD-10-CM

## 2023-10-06 DIAGNOSIS — Z113 Encounter for screening for infections with a predominantly sexual mode of transmission: Secondary | ICD-10-CM | POA: Insufficient documentation

## 2023-10-06 LAB — BASIC METABOLIC PANEL
BUN: 10 mg/dL (ref 6–23)
CO2: 24 meq/L (ref 19–32)
Calcium: 9.3 mg/dL (ref 8.4–10.5)
Chloride: 106 meq/L (ref 96–112)
Creatinine, Ser: 0.71 mg/dL (ref 0.40–1.50)
GFR: 129.26 mL/min (ref >60.00)
Glucose, Bld: 95 mg/dL (ref 70–99)
Potassium: 3.8 meq/L (ref 3.5–5.1)
Sodium: 138 meq/L (ref 135–145)

## 2023-10-06 LAB — HEMOGLOBIN A1C: Hgb A1c MFr Bld: 5.3 % (ref 4.6–6.5)

## 2023-10-06 MED ORDER — MUPIROCIN 2 % EX OINT: 1.0000 | TOPICAL_OINTMENT | Freq: Two times a day (BID) | CUTANEOUS | 0 refills | Status: AC

## 2023-10-06 NOTE — Patient Instructions (Signed)
It was great to see you!  Wash the area with soap and water twice a day and apply a thin layer of mupirocin ointment.   Stop using the alcohol.   We are checking your labs today and will let you know the results via mychart/phone.   Let's follow-up if symptoms worsen or any concerns   Take care,  Rodman Pickle, NP

## 2023-10-06 NOTE — Progress Notes (Signed)
   Established Patient Office Visit  Subjective   Patient ID: Jamie Ochoa, male    DOB: 2001-06-12  Age: 22 y.o. MRN: [id number]  Chief Complaint  Patient presents with   STD Check Up    Request blood work and urine check    HPI  He notes that he had an ingrown hair in right groin. He was having pain and pulled out 2 of the 3 ingrown hairs. Then he shaved and started noticing multiple bumps in that area. Some of them then started turning slightly white. He put some cream on the area and alcohol spray. He states that it is going down now this morning. He denies fevers. He states that it was painful. He denies drainage. He denies dysuria. He would like to be tested for STDs.     ROS See pertinent positives and negatives per HPI.    Objective:     BP 128/88 (BP Location: Left Arm)   Pulse (!) 102   Temp (!) 97.1 F (36.2 C)   Ht 5\' 9"  (1.753 m)   Wt 253 lb 3.2 oz (114.9 kg)   SpO2 98%   BMI 37.39 kg/m    Physical Exam Vitals and nursing note reviewed. Exam conducted with a chaperone present.  Constitutional:      Appearance: Normal appearance.  HENT:     Head: Normocephalic.  Eyes:     Conjunctiva/sclera: Conjunctivae normal.  Pulmonary:     Effort: Pulmonary effort is normal.  Musculoskeletal:     Cervical back: Normal range of motion.  Skin:    General: Skin is warm.     Comments: Raised, red area to right groin with several white bumps surrounding area  Neurological:     General: No focal deficit present.     Mental Status: He is alert and oriented to person, place, and time.  Psychiatric:        Mood and Affect: Mood normal.        Behavior: Behavior normal.        Thought Content: Thought content normal.        Judgment: Judgment normal.       Assessment & Plan:   Problem List Items Addressed This Visit   None Visit Diagnoses     Ingrown hair    -  Primary   Wash area twice daily and apply thin layer of mupirocin ointment. Follow-up if  symptoms worsen or don't improve.   Screen for STD (sexually transmitted disease)       Screen STDs today.   Relevant Orders   HIV Antibody (routine testing w rflx)   RPR   HSV(herpes simplex vrs) 1+2 ab-IgG   Urine cytology ancillary only   IFG (impaired fasting glucose)       Glucose was elevated in prior labs, will repeat BMP and check A1c today   Relevant Orders   Basic metabolic panel   Hemoglobin A1c       Return if symptoms worsen or fail to improve.    Gerre Scull, NP

## 2023-10-07 ENCOUNTER — Ambulatory Visit: Payer: BC Managed Care – PPO | Admitting: Nurse Practitioner

## 2023-10-07 ENCOUNTER — Telehealth: Payer: Self-pay | Admitting: Nurse Practitioner

## 2023-10-07 LAB — URINE CYTOLOGY ANCILLARY ONLY
Chlamydia: NEGATIVE
Comment: NEGATIVE
Comment: NORMAL
Neisseria Gonorrhea: NEGATIVE

## 2023-10-07 LAB — HSV(HERPES SIMPLEX VRS) I + II AB-IGG
HAV 1 IGG,TYPE SPECIFIC AB: 3.79 {index} — ABNORMAL HIGH
HSV 2 IGG,TYPE SPECIFIC AB: <0.9 {index}

## 2023-10-07 LAB — RPR: RPR Ser Ql: NONREACTIVE

## 2023-10-07 LAB — HIV ANTIBODY (ROUTINE TESTING W REFLEX): HIV 1&2 Ab, 4th Generation: NONREACTIVE

## 2023-10-07 NOTE — Telephone Encounter (Signed)
Pt said he had blood work done and want to know if that covered everything .please call patient

## 2023-10-07 NOTE — Telephone Encounter (Signed)
I returned patient's call and he questioned which 2 labs have not resulted. Patient notified.

## 2023-10-12 NOTE — Telephone Encounter (Signed)
I returned patient's call and he would like for Lauren to call him. He had questions regarding test results and would not elaborate,

## 2023-10-12 NOTE — Telephone Encounter (Signed)
Please give pt a cb.  

## 2023-10-12 NOTE — Telephone Encounter (Signed)
Lvm for patient to return call.

## 2023-10-13 NOTE — Telephone Encounter (Signed)
LVM to call the office and schedule a virtual visit to discuss results.

## 2023-10-14 ENCOUNTER — Telehealth (INDEPENDENT_AMBULATORY_CARE_PROVIDER_SITE_OTHER): Payer: BC Managed Care – PPO | Admitting: Nurse Practitioner

## 2023-10-14 ENCOUNTER — Encounter: Payer: Self-pay | Admitting: Nurse Practitioner

## 2023-10-14 DIAGNOSIS — B009 Herpesviral infection, unspecified: Secondary | ICD-10-CM

## 2023-10-14 NOTE — Assessment & Plan Note (Signed)
Discussed this is a virus and will always be in his body. It may flare-up from time to time. If it does, he can call and we will send in valtrex. Discussed information about HSV and how it is spread. Follow-up with any concerns or further questions.

## 2023-10-14 NOTE — Progress Notes (Signed)
[facility] [address] [place] [address] Dept: [phone] Dept Fax: [phone]  Virtual Video Visit  I connected with Wille Glaser Ochoa on 10/14/23 at  3:40 PM EST by a video enabled telemedicine application and verified that I am speaking with the correct person using two identifiers.  Location patient: Home Location provider: Clinic Persons participating in the virtual visit: Patient; Rodman Pickle, NP; Malena Peer, CMA  I discussed the limitations of evaluation and management by telemedicine and the availability of in person appointments. The patient expressed understanding and agreed to proceed.  Chief Complaint  Patient presents with   Discuss Lab Results    Has questions about test results    SUBJECTIVE:  HPI: Jamie Ochoa is a 22 y.o. male who presents with questions about recent labs.  He was told he was positive for HSV-1. He has noticed some white bumps in his mouth that are painful on occasion. He doesn't have any currently. Otherwise he has not noticed any symptoms of HSV 1.   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  Family History  Problem Relation Age of Onset   Healthy Mother    Hypertension Father     Social History   Tobacco Use   Smoking status: Never   Smokeless tobacco: Never  Vaping Use   Vaping status: Never Used  Substance Use Topics   Alcohol use: Yes    Comment: socially   Drug use: Not Currently    Types: Marijuana     Current Outpatient Medications:    mupirocin ointment (BACTROBAN) 2 %, Apply 1 Application topically 2 (two) times daily., Disp: 22 g, Rfl: 0  No Known Allergies  ROS: See pertinent positives and  negatives per HPI.  OBSERVATIONS/OBJECTIVE:  VITALS per patient if applicable: There were no vitals filed for this visit. There is no height or weight on file to calculate BMI.    GENERAL: Alert and oriented. Appears well and in no acute distress.  HEENT: Atraumatic. Conjunctiva clear. No obvious abnormalities on inspection of external nose and ears.  NECK: Normal movements of the head and neck.  LUNGS: On inspection, no signs of respiratory distress. Breathing rate appears normal. No obvious gross SOB, gasping or wheezing, and no conversational dyspnea.  CV: No obvious cyanosis.  MS: Moves all visible extremities without noticeable abnormality.  PSYCH/NEURO: Pleasant and cooperative. No obvious depression or anxiety. Speech and thought processing grossly intact.  ASSESSMENT AND PLAN:  Problem List Items Addressed This Visit       Other   HSV-1 infection - Primary    Discussed this is a virus and will always be in his body. It may flare-up from time to time. If it does, he can call and we will send in valtrex. Discussed information about HSV and how it is spread. Follow-up with any concerns or further questions.         I discussed the assessment and treatment plan with the patient. The patient was provided an opportunity to ask questions and all were answered. The patient agreed with the plan and demonstrated an understanding of the instructions.   The patient was advised to call back or seek an in-person evaluation if  the symptoms worsen or if the condition fails to improve as anticipated.   Gerre Scull, NP

## 2024-01-18 DIAGNOSIS — Z6835 Body mass index (BMI) 35.0-35.9, adult: Secondary | ICD-10-CM | POA: Diagnosis not present

## 2024-01-18 DIAGNOSIS — K219 Gastro-esophageal reflux disease without esophagitis: Secondary | ICD-10-CM | POA: Diagnosis not present

## 2024-02-08 DIAGNOSIS — Z6835 Body mass index (BMI) 35.0-35.9, adult: Secondary | ICD-10-CM | POA: Diagnosis not present

## 2024-02-08 DIAGNOSIS — K219 Gastro-esophageal reflux disease without esophagitis: Secondary | ICD-10-CM | POA: Diagnosis not present
